# Patient Record
Sex: Male | Born: 1985 | Race: Black or African American | Hispanic: No | Marital: Single | Smoking: Current every day smoker
Health system: Southern US, Community
[De-identification: ages and names within clinical notes are randomized; demographics above are authoritative.]

## PROBLEM LIST (undated history)

## (undated) ENCOUNTER — Emergency Department (HOSPITAL_COMMUNITY): Admission: EM | Disposition: A | Discharge status: Other Acute Inpatient Hospital | Payer: Self-pay

---
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
## 2006-09-04 ENCOUNTER — Emergency Department (HOSPITAL_COMMUNITY): Admission: EM | Admit: 2006-09-04 | Discharge: 2006-09-04 | Payer: Self-pay | Admitting: Emergency Medicine

## 2007-02-21 ENCOUNTER — Emergency Department (HOSPITAL_COMMUNITY): Admission: EM | Admit: 2007-02-21 | Discharge: 2007-02-21 | Payer: Self-pay | Admitting: Emergency Medicine

## 2008-01-18 ENCOUNTER — Emergency Department (HOSPITAL_COMMUNITY): Admission: EM | Admit: 2008-01-18 | Discharge: 2008-01-18 | Payer: Self-pay | Admitting: Emergency Medicine

## 2008-08-05 ENCOUNTER — Emergency Department (HOSPITAL_COMMUNITY): Admission: EM | Admit: 2008-08-05 | Discharge: 2008-08-05 | Payer: Self-pay | Admitting: Emergency Medicine

## 2009-09-13 ENCOUNTER — Emergency Department (HOSPITAL_COMMUNITY): Admission: EM | Admit: 2009-09-13 | Discharge: 2009-09-13 | Payer: Self-pay | Admitting: Emergency Medicine

## 2010-11-18 ENCOUNTER — Emergency Department (HOSPITAL_COMMUNITY)
Admission: EM | Admit: 2010-11-18 | Discharge: 2010-11-18 | Disposition: A | Discharge status: Home / Self Care | Payer: Self-pay | Attending: Emergency Medicine | Admitting: Emergency Medicine

## 2010-11-18 DIAGNOSIS — R1013 Epigastric pain: Secondary | ICD-10-CM | POA: Insufficient documentation

## 2010-11-18 DIAGNOSIS — R079 Chest pain, unspecified: Secondary | ICD-10-CM | POA: Insufficient documentation

## 2010-11-18 DIAGNOSIS — K219 Gastro-esophageal reflux disease without esophagitis: Secondary | ICD-10-CM | POA: Insufficient documentation

## 2010-11-18 DIAGNOSIS — R066 Hiccough: Secondary | ICD-10-CM | POA: Insufficient documentation

## 2013-07-18 ENCOUNTER — Encounter (HOSPITAL_COMMUNITY): Payer: Self-pay | Admitting: Emergency Medicine

## 2013-07-18 ENCOUNTER — Emergency Department (HOSPITAL_COMMUNITY)
Admission: EM | Admit: 2013-07-18 | Discharge: 2013-07-18 | Disposition: A | Discharge status: Home / Self Care | Payer: Self-pay | Attending: Emergency Medicine | Admitting: Emergency Medicine

## 2013-07-18 ENCOUNTER — Emergency Department (INDEPENDENT_AMBULATORY_CARE_PROVIDER_SITE_OTHER)
Admission: EM | Admit: 2013-07-18 | Discharge: 2013-07-18 | Disposition: A | Discharge status: Home / Self Care | Payer: Self-pay | Source: Home / Self Care | Attending: Family Medicine | Admitting: Family Medicine

## 2013-07-18 DIAGNOSIS — K029 Dental caries, unspecified: Secondary | ICD-10-CM | POA: Insufficient documentation

## 2013-07-18 DIAGNOSIS — F17200 Nicotine dependence, unspecified, uncomplicated: Secondary | ICD-10-CM | POA: Insufficient documentation

## 2013-07-18 DIAGNOSIS — Z79899 Other long term (current) drug therapy: Secondary | ICD-10-CM | POA: Insufficient documentation

## 2013-07-18 DIAGNOSIS — K089 Disorder of teeth and supporting structures, unspecified: Secondary | ICD-10-CM | POA: Insufficient documentation

## 2013-07-18 DIAGNOSIS — K0889 Other specified disorders of teeth and supporting structures: Secondary | ICD-10-CM

## 2013-07-18 DIAGNOSIS — Z881 Allergy status to other antibiotic agents status: Secondary | ICD-10-CM | POA: Insufficient documentation

## 2013-07-18 MED ORDER — KETOROLAC TROMETHAMINE 60 MG/2ML IM SOLN
60.0000 mg | Freq: Once | INTRAMUSCULAR | Status: AC
  Administered 2013-07-18: 60 mg via INTRAMUSCULAR
  Filled 2013-07-18: qty 2

## 2013-07-18 MED ORDER — AMOXICILLIN 500 MG PO CAPS: 500.0000 mg | ORAL_CAPSULE | Freq: Three times a day (TID) | ORAL | Status: DC

## 2013-07-18 MED ORDER — HYDROCODONE-ACETAMINOPHEN 5-325 MG PO TABS: 1.0000 | ORAL_TABLET | Freq: Four times a day (QID) | ORAL | Status: DC | PRN

## 2013-07-18 MED ORDER — HYDROCODONE-ACETAMINOPHEN 5-325 MG PO TABS
ORAL_TABLET | ORAL | Status: AC
  Filled 2013-07-18: qty 1

## 2013-07-18 MED ORDER — HYDROCODONE-ACETAMINOPHEN 5-325 MG PO TABS
1.0000 | ORAL_TABLET | Freq: Once | ORAL | Status: AC
  Administered 2013-07-18: 1 via ORAL

## 2013-07-18 NOTE — ED Provider Notes (Signed)
Juan Park is a 27 y.o. male who presents to Urgent Care today for left rear lower tooth pain present for one day. Patient denies any injury. The pain is severe and worse with chewing. History ibuprofen which has not helped. No fevers or chills nausea vomiting or diarrhea.   History reviewed. No pertinent past medical history. History  Substance Use Topics  . Smoking status: Current Every Day Smoker  . Smokeless tobacco: Not on file  . Alcohol Use: Yes   ROS as above Medications reviewed. No current facility-administered medications for this encounter.   Current Outpatient Prescriptions  Medication Sig Dispense Refill  . amoxicillin (AMOXIL) 500 MG capsule Take 1 capsule (500 mg total) by mouth 3 (three) times daily.  60 capsule  0  . HYDROcodone-acetaminophen (NORCO/VICODIN) 5-325 MG per tablet Take 1 tablet by mouth every 6 (six) hours as needed.  15 tablet  0    Exam:  BP 137/89  Pulse 60  Temp(Src) 97.9 F (36.6 C) (Oral)  Resp 14  SpO2 100% Gen: Well NAD HEENT: EOMI,  MMM, poor dentition throughout. Broken left rear most tooth. Tender to touch. Gumline erythema present.  .   Assessment and Plan: 27 y.o. male with dental pain. Plan to treat with amoxicillin, and Norco. Refer patient to dentist.  Discussed warning signs or symptoms. Please see discharge instructions. Patient expresses understanding.      Rodolph Bong, MD 07/18/13 [phone]

## 2013-07-18 NOTE — ED Notes (Signed)
Restless; c/o toothache since this AM; no relief w OTC

## 2013-07-18 NOTE — ED Notes (Signed)
Discharge instructions reviewed with pt. Pt verbalized understanding.   

## 2013-07-18 NOTE — ED Provider Notes (Signed)
CSN: [id number]     Arrival date & time 07/18/13  2041 History  This chart was scribed for non-physician practitioner Francee Piccolo PA-C working with Junius Argyle, MD, by Andrew Au, ED Scribe. This patient was seen in room TR07C/TR07C and the patient's care was started at 10:32 PM     Chief Complaint  Patient presents with  . Dental Pain    The history is provided by the patient. No language interpreter was used.   HPI Comments: Juan Park is a 27 y.o. male who presents to the Emergency Department complaining of 1 day of gradual onset, gradually worsening, constant left upper dental pain. He rates dental pain 10/10 and describes this pain as sharp. Pt was seen by UC today for similar symptoms and was prescribed amoxicillin, and Norco. Pt states he has taken these medications but without relief. He states he has taken one dose of the Norco. He states nothing eases the pain. Pt denies having fever, HA, drainage or trouble swallowing.   History reviewed. No pertinent past medical history. History reviewed. No pertinent past surgical history. No family history on file. History  Substance Use Topics  . Smoking status: Current Every Day Smoker  . Smokeless tobacco: Not on file  . Alcohol Use: Yes    Review of Systems  Constitutional: Negative for fever.  HENT: Positive for dental problem. Negative for trouble swallowing.   Neurological: Negative for headaches.    Allergies  Erythromycin  Home Medications   Current Outpatient Rx  Name  Route  Sig  Dispense  Refill  . amoxicillin (AMOXIL) 500 MG capsule   Oral   Take 1 capsule (500 mg total) by mouth 3 (three) times daily.   60 capsule   0   . HYDROcodone-acetaminophen (NORCO/VICODIN) 5-325 MG per tablet   Oral   Take 1 tablet by mouth every 6 (six) hours as needed for moderate pain.         Marland Kitchen ibuprofen (ADVIL,MOTRIN) 800 MG tablet   Oral   Take 800 mg by mouth every 8 (eight) hours as needed for  moderate pain.          There were no vitals taken for this visit. Physical Exam  Constitutional: He is oriented to person, place, and time. He appears well-developed and well-nourished. No distress.  HENT:  Head: Normocephalic and atraumatic.  Right Ear: External ear normal.  Left Ear: External ear normal.  Nose: Nose normal.  Mouth/Throat: Uvula is midline and mucous membranes are normal. No trismus in the jaw. Abnormal dentition. Dental caries present. No uvula swelling. No oropharyngeal exudate.  Eyes: Conjunctivae are normal.  Neck: Normal range of motion. Neck supple.  Cardiovascular: Normal rate.   Pulmonary/Chest: Effort normal.  Lymphadenopathy:    He has no cervical adenopathy.  Neurological: He is alert and oriented to person, place, and time.  Skin: Skin is warm and dry. He is not diaphoretic.  Psychiatric: He has a normal mood and affect.    ED Course  Procedures  DIAGNOSTIC STUDIES: Oxygen Saturation is 100% on RA, normal by my interpretation.    COORDINATION OF CARE:  10:39 PM-Discussed treatment plan which includes Toradol with pt at bedside and pt agreed to plan.   Labs Review Labs Reviewed - No data to display Imaging Review No results found.  EKG Interpretation   None       MDM   1. Pain, dental     Patient demanding to be given "the  strong pain medication" advised patient he could not demand pain medication.  Afebrile, NAD, non-toxic appearing, AAOx4. Patient with toothache.  No gross abscess.  Exam unconcerning for Ludwig's angina or spread of infection.  Will treat pain in ED with Toradol. Advised to take prescribed antibiotic and pain medication by [facility], that I would not change his pain medication. Patient is agreeable to plan. Patient is stable at time of discharge     I personally performed the services described in this documentation, which was scribed in my presence. The recorded information has been reviewed and is  accurate.    Jeannetta Ellis, PA-C 07/18/13 2343

## 2013-07-18 NOTE — ED Notes (Signed)
C/o dental pain, seen at [facility], here from [facility], was given vicodin, ibuprofen and amox, no relief, here for pain control, pinpoints to L upper molar area pain, some swelling noted, (denies: fever, dizziness, drainage,nv), pacing, "can't sit down".

## 2013-07-18 NOTE — ED Notes (Signed)
Offered pt Ibuprofen for pain until PA comes to see him. Pt instructed me that he has taken a bunch today, unable to tell me how many he has taken or the last time he took it.

## 2013-07-18 NOTE — ED Notes (Signed)
Jen PA at bedside. 

## 2013-07-18 NOTE — ED Notes (Signed)
Pt c/o pain to upper left side of mouth. Pt has some swelling to gum. Pt was seen at urgent care and given vicodin and antibiotic pt states he has had no relief. Pt up pacing the room. Pt rates pain 10/10.

## 2013-07-19 NOTE — ED Provider Notes (Signed)
Medical screening examination/treatment/procedure(s) were performed by non-physician practitioner and as supervising physician I was immediately available for consultation/collaboration.  EKG Interpretation   None         Junius Argyle, MD 07/19/13 1257

## 2014-05-21 ENCOUNTER — Emergency Department (HOSPITAL_COMMUNITY): Payer: Self-pay

## 2014-05-21 ENCOUNTER — Encounter (HOSPITAL_COMMUNITY): Payer: Self-pay | Admitting: Emergency Medicine

## 2014-05-21 ENCOUNTER — Emergency Department (HOSPITAL_COMMUNITY)
Admission: EM | Admit: 2014-05-21 | Discharge: 2014-05-21 | Disposition: A | Discharge status: Home / Self Care | Payer: Self-pay | Attending: Emergency Medicine | Admitting: Emergency Medicine

## 2014-05-21 DIAGNOSIS — J40 Bronchitis, not specified as acute or chronic: Secondary | ICD-10-CM | POA: Insufficient documentation

## 2014-05-21 DIAGNOSIS — Z720 Tobacco use: Secondary | ICD-10-CM | POA: Insufficient documentation

## 2014-05-21 MED ORDER — PREDNISONE 10 MG PO TABS: 20.0000 mg | ORAL_TABLET | Freq: Every day | ORAL | Status: AC

## 2014-05-21 MED ORDER — ALBUTEROL SULFATE HFA 108 (90 BASE) MCG/ACT IN AERS: 1.0000 | INHALATION_SPRAY | Freq: Four times a day (QID) | RESPIRATORY_TRACT | Status: DC | PRN

## 2014-05-21 NOTE — ED Notes (Signed)
Pt. reports productive cough for 1 week , denies fever or chills , respirations unlabored .

## 2014-05-21 NOTE — ED Notes (Signed)
This RN into pt room to complete rapid strep- pt refused.  Dr. Freida BusmanAllen made aware.  Discharge paperwork completed.

## 2014-05-21 NOTE — ED Provider Notes (Signed)
CSN: [id number]     Arrival date & time 05/21/14  0020 History   First MD Initiated Contact with Patient 05/21/14 0200     Chief Complaint  Patient presents with  . Cough     (Consider location/radiation/quality/duration/timing/severity/associated sxs/prior Treatment) HPI Comments: Patient here with productive cough x1 week that is worse in the morning. He is to daily tobacco user. No fever or chills. Cough has been nonproductive. No vomiting or diarrhea. Has had ear pain as well as sore throat denies any trouble swallowing. Symptoms persistent and unresponsive to over-the-counter medications. Denies any neck pain or photophobia. No urinary symptoms.  Patient is a 28 y.o. male presenting with cough. The history is provided by the patient.  Cough   History reviewed. No pertinent past medical history. History reviewed. No pertinent past surgical history. No family history on file. History  Substance Use Topics  . Smoking status: Current Every Day Smoker  . Smokeless tobacco: Not on file  . Alcohol Use: Yes    Review of Systems  Respiratory: Positive for cough.   All other systems reviewed and are negative.     Allergies  Erythromycin  Home Medications   Prior to Admission medications   Medication Sig Start Date End Date Taking? Authorizing Provider  HYDROcodone-acetaminophen (NORCO/VICODIN) 5-325 MG per tablet Take 1 tablet by mouth every 6 (six) hours as needed for moderate pain.    Historical Provider, MD  ibuprofen (ADVIL,MOTRIN) 800 MG tablet Take 800 mg by mouth every 8 (eight) hours as needed for moderate pain.    Historical Provider, MD   BP 121/76  Pulse 65  Temp(Src) 98.4 F (36.9 C) (Oral)  Resp 18  Ht 5\' 6"  (1.676 m)  Wt 217 lb (98.431 kg)  BMI 35.04 kg/m2  SpO2 96% Physical Exam  Nursing note and vitals reviewed. Constitutional: He is oriented to person, place, and time. He appears well-developed and well-nourished.  Non-toxic appearance. No distress.   HENT:  Head: Normocephalic and atraumatic.  Mouth/Throat: Posterior oropharyngeal erythema present. No oropharyngeal exudate or posterior oropharyngeal edema.  Eyes: Conjunctivae, EOM and lids are normal. Pupils are equal, round, and reactive to light.  Neck: Normal range of motion. Neck supple. No tracheal deviation present. No mass present.  Cardiovascular: Normal rate, regular rhythm and normal heart sounds.  Exam reveals no gallop.   No murmur heard. Pulmonary/Chest: Effort normal and breath sounds normal. No stridor. No respiratory distress. He has no decreased breath sounds. He has no wheezes. He has no rhonchi. He has no rales.  Abdominal: Soft. Normal appearance and bowel sounds are normal. He exhibits no distension. There is no tenderness. There is no rebound and no CVA tenderness.  Musculoskeletal: Normal range of motion. He exhibits no edema and no tenderness.  Neurological: He is alert and oriented to person, place, and time. He has normal strength. No cranial nerve deficit or sensory deficit. GCS eye subscore is 4. GCS verbal subscore is 5. GCS motor subscore is 6.  Skin: Skin is warm and dry. No abrasion and no rash noted.  Psychiatric: He has a normal mood and affect. His speech is normal and behavior is normal.    ED Course  Procedures (including critical care time) Labs Review Labs Reviewed  RAPID STREP SCREEN    Imaging Review Dg Chest 2 View  05/21/2014   CLINICAL DATA:  Persistent productive cough x2 weeks.  EXAM: CHEST  2 VIEW  COMPARISON:  09/04/2006.  FINDINGS: Mediastinum and hilar structures  are normal. No acute cardiopulmonary disease noted. No pleural effusion or pneumothorax. No acute bony abnormality. Chest is stable from prior study.  IMPRESSION: No active cardiopulmonary disease.   Electronically Signed   By: Maisie Fushomas  Register   On: 05/21/2014 01:45     EKG Interpretation None      MDM   Final diagnoses:  None    Patient offered rapid strep and  has refused. We'll treat for both bronchitis    Toy BakerAnthony T Yarixa Lightcap, MD 05/21/14 [phone]

## 2014-05-21 NOTE — Discharge Instructions (Signed)
Smoking Cessation °Quitting smoking is important to your health and has many advantages. However, it is not always easy to quit since nicotine is a very addictive drug. Oftentimes, people try 3 times or more before being able to quit. This document explains the best ways for you to prepare to quit smoking. Quitting takes hard work and a lot of effort, but you can do it. °ADVANTAGES OF QUITTING SMOKING °· You will live longer, feel better, and live better. °· Your body will feel the impact of quitting smoking almost immediately. °¨ Within 20 minutes, blood pressure decreases. Your pulse returns to its normal level. °¨ After 8 hours, carbon monoxide levels in the blood return to normal. Your oxygen level increases. °¨ After 24 hours, the chance of having a heart attack starts to decrease. Your breath, hair, and body stop smelling like smoke. °¨ After 48 hours, damaged nerve endings begin to recover. Your sense of taste and smell improve. °¨ After 72 hours, the body is virtually free of nicotine. Your bronchial tubes relax and breathing becomes easier. °¨ After 2 to 12 weeks, lungs can hold more air. Exercise becomes easier and circulation improves. °· The risk of having a heart attack, stroke, cancer, or lung disease is greatly reduced. °¨ After 1 year, the risk of coronary heart disease is cut in half. °¨ After 5 years, the risk of stroke falls to the same as a nonsmoker. °¨ After 10 years, the risk of lung cancer is cut in half and the risk of other cancers decreases significantly. °¨ After 15 years, the risk of coronary heart disease drops, usually to the level of a nonsmoker. °· If you are pregnant, quitting smoking will improve your chances of having a healthy baby. °· The people you live with, especially any children, will be healthier. °· You will have extra money to spend on things other than cigarettes. °QUESTIONS TO THINK ABOUT BEFORE ATTEMPTING TO QUIT °You may want to talk about your answers with your  health care provider. °· Why do you want to quit? °· If you tried to quit in the past, what helped and what did not? °· What will be the most difficult situations for you after you quit? How will you plan to handle them? °· Who can help you through the tough times? Your family? Friends? A health care provider? °· What pleasures do you get from smoking? What ways can you still get pleasure if you quit? °Here are some questions to ask your health care provider: °· How can you help me to be successful at quitting? °· What medicine do you think would be best for me and how should I take it? °· What should I do if I need more help? °· What is smoking withdrawal like? How can I get information on withdrawal? °GET READY °· Set a quit date. °· Change your environment by getting rid of all cigarettes, ashtrays, matches, and lighters in your home, car, or work. Do not let people smoke in your home. °· Review your past attempts to quit. Think about what worked and what did not. °GET SUPPORT AND ENCOURAGEMENT °You have a better chance of being successful if you have help. You can get support in many ways. °· Tell your family, friends, and coworkers that you are going to quit and need their support. Ask them not to smoke around you. °· Get individual, group, or telephone counseling and support. Programs are available at local hospitals and health centers. Call   your local health department for information about programs in your area. °· Spiritual beliefs and practices may help some smokers quit. °· Download a "quit meter" on your computer to keep track of quit statistics, such as how long you have gone without smoking, cigarettes not smoked, and money saved. °· Get a self-help book about quitting smoking and staying off tobacco. °LEARN NEW SKILLS AND BEHAVIORS °· Distract yourself from urges to smoke. Talk to someone, go for a walk, or occupy your time with a task. °· Change your normal routine. Take a different route to work.  Drink tea instead of coffee. Eat breakfast in a different place. °· Reduce your stress. Take a hot bath, exercise, or read a book. °· Plan something enjoyable to do every day. Reward yourself for not smoking. °· Explore interactive web-based programs that specialize in helping you quit. °GET MEDICINE AND USE IT CORRECTLY °Medicines can help you stop smoking and decrease the urge to smoke. Combining medicine with the above behavioral methods and support can greatly increase your chances of successfully quitting smoking. °· Nicotine replacement therapy helps deliver nicotine to your body without the negative effects and risks of smoking. Nicotine replacement therapy includes nicotine gum, lozenges, inhalers, nasal sprays, and skin patches. Some may be available over-the-counter and others require a prescription. °· Antidepressant medicine helps people abstain from smoking, but how this works is unknown. This medicine is available by prescription. °· Nicotinic receptor partial agonist medicine simulates the effect of nicotine in your brain. This medicine is available by prescription. °Ask your health care provider for advice about which medicines to use and how to use them based on your health history. Your health care provider will tell you what side effects to look out for if you choose to be on a medicine or therapy. Carefully read the information on the package. Do not use any other product containing nicotine while using a nicotine replacement product.  °RELAPSE OR DIFFICULT SITUATIONS °Most relapses occur within the first 3 months after quitting. Do not be discouraged if you start smoking again. Remember, most people try several times before finally quitting. You may have symptoms of withdrawal because your body is used to nicotine. You may crave cigarettes, be irritable, feel very hungry, cough often, get headaches, or have difficulty concentrating. The withdrawal symptoms are only temporary. They are strongest  when you first quit, but they will go away within 10-14 days. °To reduce the chances of relapse, try to: °· Avoid drinking alcohol. Drinking lowers your chances of successfully quitting. °· Reduce the amount of caffeine you consume. Once you quit smoking, the amount of caffeine in your body increases and can give you symptoms, such as a rapid heartbeat, sweating, and anxiety. °· Avoid smokers because they can make you want to smoke. °· Do not let weight gain distract you. Many smokers will gain weight when they quit, usually less than 10 pounds. Eat a healthy diet and stay active. You can always lose the weight gained after you quit. °· Find ways to improve your mood other than smoking. °FOR MORE INFORMATION  °www.smokefree.gov  °Document Released: 07/22/2001 Document Revised: 12/12/2013 Document Reviewed: 11/06/2011 °Exit[employer]® Patient Information ©2015 [employer]. This information is not intended to replace advice given to you by your health care provider. Make sure you discuss any questions you have with your health care provider. °Acute Bronchitis °Bronchitis is inflammation of the airways that extend from the windpipe into the lungs (bronchi). The inflammation often causes mucus   to develop. This leads to a cough, which is the most common symptom of bronchitis.  °In acute bronchitis, the condition usually develops suddenly and goes away over time, usually in a couple weeks. Smoking, allergies, and asthma can make bronchitis worse. Repeated episodes of bronchitis may cause further lung problems.  °CAUSES °Acute bronchitis is most often caused by the same virus that causes a cold. The virus can spread from person to person (contagious) through coughing, sneezing, and touching contaminated objects. °SIGNS AND SYMPTOMS  °· Cough.   °· Fever.   °· Coughing up mucus.   °· Body aches.   °· Chest congestion.   °· Chills.   °· Shortness of breath.   °· Sore throat.   °DIAGNOSIS  °Acute bronchitis is usually diagnosed  through a physical exam. Your health care provider will also ask you questions about your medical history. Tests, such as chest X-rays, are sometimes done to rule out other conditions.  °TREATMENT  °Acute bronchitis usually goes away in a couple weeks. Oftentimes, no medical treatment is necessary. Medicines are sometimes given for relief of fever or cough. Antibiotic medicines are usually not needed but may be prescribed in certain situations. In some cases, an inhaler may be recommended to help reduce shortness of breath and control the cough. A cool mist vaporizer may also be used to help thin bronchial secretions and make it easier to clear the chest.  °HOME CARE INSTRUCTIONS °· Get plenty of rest.   °· Drink enough fluids to keep your urine clear or pale yellow (unless you have a medical condition that requires fluid restriction). Increasing fluids may help thin your respiratory secretions (sputum) and reduce chest congestion, and it will prevent dehydration.   °· Take medicines only as directed by your health care provider. °· If you were prescribed an antibiotic medicine, finish it all even if you start to feel better. °· Avoid smoking and secondhand smoke. Exposure to cigarette smoke or irritating chemicals will make bronchitis worse. If you are a smoker, consider using nicotine gum or skin patches to help control withdrawal symptoms. Quitting smoking will help your lungs heal faster.   °· Reduce the chances of another bout of acute bronchitis by washing your hands frequently, avoiding people with cold symptoms, and trying not to touch your hands to your mouth, nose, or eyes.   °· Keep all follow-up visits as directed by your health care provider.   °SEEK MEDICAL CARE IF: °Your symptoms do not improve after 1 week of treatment.  °SEEK IMMEDIATE MEDICAL CARE IF: °· You develop an increased fever or chills.   °· You have chest pain.   °· You have severe shortness of breath. °· You have bloody sputum.   °· You  develop dehydration. °· You faint or repeatedly feel like you are going to pass out. °· You develop repeated vomiting. °· You develop a severe headache. °MAKE SURE YOU:  °· Understand these instructions. °· Will watch your condition. °· Will get help right away if you are not doing well or get worse. °Document Released: 09/04/2004 Document Revised: 12/12/2013 Document Reviewed: 01/18/2013 °ExitCare® Patient Information ©2015 [employer]. This information is not intended to replace advice given to you by your health care provider. Make sure you discuss any questions you have with your health care provider. ° °

## 2014-08-03 ENCOUNTER — Emergency Department (HOSPITAL_COMMUNITY)
Admission: EM | Admit: 2014-08-03 | Discharge: 2014-08-03 | Disposition: A | Discharge status: Home / Self Care | Payer: Self-pay | Attending: Emergency Medicine | Admitting: Emergency Medicine

## 2014-08-03 ENCOUNTER — Encounter (HOSPITAL_COMMUNITY): Payer: Self-pay | Admitting: Emergency Medicine

## 2014-08-03 DIAGNOSIS — R066 Hiccough: Secondary | ICD-10-CM | POA: Insufficient documentation

## 2014-08-03 DIAGNOSIS — Z720 Tobacco use: Secondary | ICD-10-CM | POA: Insufficient documentation

## 2014-08-03 DIAGNOSIS — R112 Nausea with vomiting, unspecified: Secondary | ICD-10-CM | POA: Insufficient documentation

## 2014-08-03 DIAGNOSIS — Z7952 Long term (current) use of systemic steroids: Secondary | ICD-10-CM | POA: Insufficient documentation

## 2014-08-03 MED ORDER — GI COCKTAIL ~~LOC~~
30.0000 mL | Freq: Once | ORAL | Status: AC
  Administered 2014-08-03: 30 mL via ORAL
  Filled 2014-08-03: qty 30

## 2014-08-03 MED ORDER — CHLORPROMAZINE HCL 25 MG PO TABS
50.0000 mg | ORAL_TABLET | Freq: Once | ORAL | Status: AC
  Administered 2014-08-03: 50 mg via ORAL
  Filled 2014-08-03: qty 2

## 2014-08-03 MED ORDER — CHLORPROMAZINE HCL 50 MG PO TABS
50.0000 mg | ORAL_TABLET | Freq: Three times a day (TID) | ORAL | Status: AC | PRN
Start: 2014-08-03 — End: ?

## 2014-08-03 NOTE — ED Notes (Signed)
Pt reports hiccups for past 2 days. Hx of same in past which was relieved with GI cocktail. sts it is becoming painful.

## 2014-08-03 NOTE — Discharge Instructions (Signed)
Hiccups A hiccup is the result of a sudden shortening of the muscle below your lungs (diaphragm). This movement of your diaphragm is then followed by the closing of your vocal cords, which causes the hiccup sound. Most people get the hiccups. Typically, hiccups last only a short amount of time. There are three types of hiccups:   Benign: last less than 48 hours.  Persistent: last more than 48 hours, but less than 1 month.  Intractable: last more than 1 month. A hiccup is a reflex. You cannot control reflexes. CAUSES  Causes of the hiccups can include:   Eating too much.  Drinking too much alcohol or fizzy drinks.  Eating too fast.  Eating or drinking hot and spicy foods or drinks.  Using certain medicines that have hiccupping as a side effect. Several medical conditions may also cause hiccups, including, but not limited to:  Stroke.  Gastroesophageal reflux.  Multiple sclerosis.  Traumatic brain injury.  Brain tumor.  Meningitis.  Having damage to the nerve that affects the diaphragm. Usually, though, hiccups have no apparent cause and are not the result of a serious medical condition. DIAGNOSIS  Tests may be performed to diagnose a possible condition associated with persistent or intractable hiccups. TREATMENT  Most cases of the hiccups need no treatment. None of the numerous home remedies have been proven to be effective. If your hiccups do require treatment, your treatment may include:  Medicine. Medicine may be given intravenously (by IV) or by mouth.  Hypnosis or acupuncture.  Surgery to the nerve that affects the diaphragm may be tried in severe cases. If your hiccups are caused by an underlying medical condition, treatment for the medical condition may be necessary.  HOME CARE INSTRUCTIONS   Eat small meals.  Limit alcohol intake to no more than 1 drink per day for nonpregnant women and 2 drinks per day for men. One drink equals 12 ounces of beer, 5 ounces  of wine, or 1 ounces of hard liquor.  Limit drinking fizzy drinks.  Eat and chew your food slowly.  Take medicines only as directed by your health care provider. SEEK MEDICAL CARE IF:   Your hiccups last for more than 48 hours.  You are given medicine, but your hiccups do not get better.  You cannot sleep or eat due to the hiccups.  You have unexpected weight loss due to the hiccups.  You have trouble breathing or swallowing.  You have a fever.  You develop severe pain in your abdomen.  You develop numbness, tingling, or weakness. Document Released: 10/06/2001 Document Revised: 12/12/2013 Document Reviewed: 09/18/2010 [facility] Patient Information 2015 [place] [place]. This information is not intended to replace advice given to you by your health care provider. Make sure you discuss any questions you have with your health care provider.   Emergency Department Resource Guide 1) Find a Doctor and Pay Out of Pocket Although you won't have to find out who is covered by your insurance plan, it is a good idea to ask around and get recommendations. You will then need to call the office and see if the doctor you have chosen will accept you as a new patient and what types of options they offer for patients who are self-pay. Some doctors offer discounts or will set up payment plans for their patients who do not have insurance, but you will need to ask so you aren't surprised when you get to your appointment.  2) Contact Your Local Health Department Not all  health departments have doctors that can see patients for sick visits, but many do, so it is worth a call to see if yours does. If you don't know where your local health department is, you can check in your phone book. The CDC also has a tool to help you locate your state's health department, and many state websites also have listings of all of their local health departments.  3) Find a Walk-in Clinic If your illness is not likely to be  very severe or complicated, you may want to try a walk in clinic. These are popping up all over the country in pharmacies, drugstores, and shopping centers. They're usually staffed by nurse practitioners or physician assistants that have been trained to treat common illnesses and complaints. They're usually fairly quick and inexpensive. However, if you have serious medical issues or chronic medical problems, these are probably not your best option.  No Primary Care Doctor: - Call Health Connect at  [phone] - they can help you locate a primary care doctor that  accepts your insurance, provides certain services, etc. - Physician Referral Service- [phone]  Chronic Pain Problems: Organization         Address  Phone   Notes  [facility]  [phone] Patients need to be referred by their primary care doctor.   Medication Assistance: Organization         Address  Phone   Notes  [facility] Medication [facility] [address] [place], [place] [address] [phone] --Must be a resident of [facility] -- Must have NO insurance coverage whatsoever (no Medicaid/ Medicare, etc.) -- The pt. MUST have a primary care doctor that directs their care regularly and follows them in the community   [facility]  [phone]   [employer]  [phone]    Agencies that provide inexpensive medical care: Organization         Address  Phone   Notes  [facility]  [phone]   [facility]    [phone]   [facility] [address] [place] [place], [place] [address] [phone]   [facility] [address] [place] [place] [phone]   Planned Parenthood    [phone]   [facility]    [phone]   Community [facility] and [facility]  [address], [place] Phone:  [phone], Fax:  [phone] Hours of Operation:  9 am - 6 pm, M-F.  Also  accepts Medicaid/Medicare and self-pay.  [facility]  [address], [employer] Phone: [phone], Fax: [phone]. Hours of Operation:  8:30 am - 5:30 pm, M-F.  Also accepts Medicaid and self-pay.  [facility] [place] [address] [place] Phone: [phone]   [facility] [address] Natasha Bence [place], [place] [phone], Ext. [phone] Mondays & Thursdays: 7-9 AM.  First 15 patients are seen on a first come, first serve basis.    Medicaid-accepting [facility] Providers:  Organization         Address  Phone   Notes  [facility] [address], [place] [phone] Also accepts self-pay patients.  [facility] [address] Laurell Josephs [place], [place]  [phone]   [facility] [address], [place] [phone]   [facility] [address] [place] [phone]   Renaye Rakers [address], [place]   [phone] Only accepts [place] patients after they have their name applied to their card.   Self-Pay (no insurance) in [facility]:  Organization         Address  Phone   Notes  Sickle Cell Patients, [facility] [address] [place], [place] [phone]   [facility] [address] [place], [place] [phone]   [facility] [place]  [address],  [phone]   [facility]/Dr. Osei-Bonsu  [address] [place] or [address], [place] [phone] Phone number for both [place] and [place] locations is the same.  [facility]gent Medical and [facility] [address], [place] [phone]   [facility] [address] [place] or [address] [phone]   [facility] [address],  [place] [phone], phone; [phone], fax Sees patients 1st and 3rd Saturday of every month.  Must not qualify for public or private insurance (i.e. Medicaid, Medicare, [place] Health Choice, Veterans' Benefits)  Household income should be no more than 200% of the poverty level The clinic cannot treat you if you are pregnant or think you are pregnant  Sexually transmitted diseases are not treated at the clinic.    Dental Care: Organization         Address  Phone  Notes  [facility] Department of [facility] [address] [place], [place] [phone] Accepts children up to age 1 who are enrolled in [place] or [place] Health Choice; pregnant women with a Medicaid card; and children who have applied for Medicaid or [place] Health Choice, but were declined, whose parents can pay a reduced fee at time of service.  [facility] of [facility]  [address], [place] [phone] Accepts children up to age 75 who are enrolled in [place] or [place] Health Choice; pregnant women with a Medicaid card; and children who have applied for Medicaid or  Health Choice, but were declined, whose parents can pay a reduced fee at time of service.  [facility]  [address] [place], [place] [phone] Patients are seen by appointment only. Walk-ins are not accepted. [facility] will see patients 74 years of age and older. Monday - Tuesday (8am-5pm) Most Wednesdays (8:30-5pm) $30 per visit, cash only  [facility]  [address], [facility] [phone] Patients are seen by appointment only. Walk-ins are not accepted. [facility] will see patients 43 years of age and older. One Wednesday Evening (Monthly: Volunteer Based).  $30 per visit, cash only  [employer]  [phone] for adults; Children under age 24, call Graduate Pediatric Dentistry at [phone].  Children aged 102-14, please call [phone] to request a pediatric application.  Dental services are provided in all areas of dental care including fillings, crowns and bridges, complete and partial dentures, implants, gum treatment, root canals, and extractions. Preventive care is also provided. Treatment is provided to both adults and children. Patients are selected via a lottery and there is often a waiting list.   [facility] [address] [place]  [phone] [email or web address]   [facility] [address], [place], [place] [phone], Ext. [phone] Second and Fourth Thursday of each month, opens at 6:30 AM; Clinic ends at 9 AM.  Patients are seen on a first-come first-served basis, and a limited number are seen during each clinic.   [facility]  [address], [place], [place] [phone]   Eligibility Requirements You must have lived in [place], [place], or [place] counties for at least the last three months.   You cannot be eligible for state or federal sponsored [place] Cityhealthcare insurance, including CIGNAVeterans Administration, [place]icaid, or [employer]Medicare.   You generally cannot be eligible for healthcare insurance through your employer.    How to apply: Eligibility screenings are held every Tuesday and Wednesday afternoon from 1:00 pm until 4:00 pm. You do not need an appointment for the interview!  [facility] [address], [place], [place] [phone]   [facility]  [phone]   [facility]  [phone]   [facility]  [phone]    Behavioral Health Resources in the Community: Intensive Outpatient Programs Organization         Address  Phone  Notes  [facility] [address], [place], [place] [phone]   [facility] [address], [place], [place] [phone]   [facility] [address], [place], [place]  [phone]   [facility] [address],  [place], [place] [phone] or [phone]   Substance Abuse Resources Organization         Address  Phone  Notes  [facility]  [phone]   [facility]  [phone]   [facility]  [phone]   [facility]  [phone]   Residential & Outpatient Substance Abuse Program  [phone]   Psychological Services Organization         Address  Phone  Notes  [facility]  [phone]- [phone]   [facility]  [phone]- [phone]   [facility] [address], [place] [phone] or [phone]    Mobile Crisis Teams Organization         Address  Phone  Notes  [facility], Mobile Crisis Care Unit  [phone]   [facility]  [address] [place], [place] [phone]   Doristine LocksSharon DeEsch [address] [place] [phone]    Self-Help/Support Groups Organization         Address  Phone             Notes  [facility] [place] - variety of support groups  [phone] Call for more information  Narcotics Anonymous (NA), [facility] [address], [place]  2 meetings at this location   Statisticianesidential Treatment Programs Organization         Address  Phone  Notes  [facility] [address],    [place]  [phone]   [facility]  [address], [place] [phone], [place], [place] [phone]   [facility] [address], [place] [phone] Admissions: 8am-3pm M-F  [facility] [address]    [place], [place] [phone]   [facility] [address], [place], [place] [phone]   [facility] [address]  [place], [place] [phone]   [facility] - Intensive Outpatient [phone], [place], [place] [phone]     [facility] ([facility] [address] [place]  [place], [place] [phone] or [phone]   [facility] ([facility]) [address] [place], [place] [phone] Accepts Medicaid  [facility] [address],  [place] [phone] Substance Abuse/Addiction Treatment   [facility] Organization         Address  Phone  Notes  [facility]  [phone]   Angie Fava, PhD [address] Ervin Knack [place], [place]   [phone] or [phone]   [facility]   [address] [place], [place] [phone]   [facility] [address], [place], [place] [phone] Insurance/Medicaid/sponsorship through [facility] and Families [address]                                    [place], [place] [phone] Therapy/tele-psych/case  [facility] [address], [place] [phone]    Dr. Lolly Mustache  [phone]   [facility] [place]  [employer] Dept. 1) [address], [place] 2) [address] [place] 3)  [address], [place] [phone]  [phone]   [facility] Child Abuse Hotline [phone]) 011-2017 or 5204733460 (After Hours)

## 2014-08-03 NOTE — ED Provider Notes (Signed)
CSN: [id number]     Arrival date & time 08/03/14  1905 History   First MD Initiated Contact with Patient 08/03/14 1912     Chief Complaint  Patient presents with  . Hiccups     (Consider location/radiation/quality/duration/timing/severity/associated sxs/prior Treatment) Patient is a 28 y.o. male presenting with general illness.  Illness Location:  Generalized Quality:  Hiccups Severity:  Moderate Onset quality:  Gradual Duration:  2 days Timing:  Constant Progression:  Unchanged Chronicity:  Recurrent Context:  Prior symptoms in past with resolution after GI cocktail Relieved by:  Nothing Worsened by:  Nothing Associated symptoms: nausea and vomiting   Associated symptoms: no abdominal pain, no chest pain, no cough, no fever, no shortness of breath and no sore throat     History reviewed. No pertinent past medical history. History reviewed. No pertinent past surgical history. No family history on file. History  Substance Use Topics  . Smoking status: Current Every Day Smoker  . Smokeless tobacco: Not on file  . Alcohol Use: Yes    Review of Systems  Constitutional: Negative for fever.  HENT: Negative for sore throat.   Respiratory: Negative for cough and shortness of breath.   Cardiovascular: Negative for chest pain.  Gastrointestinal: Positive for nausea and vomiting. Negative for abdominal pain.  All other systems reviewed and are negative.     Allergies  Erythromycin  Home Medications   Prior to Admission medications   Medication Sig Start Date End Date Taking? Authorizing Provider  chlorproMAZINE (THORAZINE) 50 MG tablet Take 1 tablet (50 mg total) by mouth 3 (three) times daily as needed (Hiccups). 08/03/14   Mirian MoMatthew Davelle Anselmi, MD  predniSONE (DELTASONE) 10 MG tablet Take 2 tablets (20 mg total) by mouth daily. 05/21/14   Toy BakerAnthony T Allen, MD   BP 121/72 mmHg  Pulse 62  Temp(Src) 98.2 F (36.8 C) (Oral)  Resp 18  SpO2 98% Physical Exam   Constitutional: He is oriented to person, place, and time. He appears well-developed and well-nourished.  HENT:  Head: Normocephalic and atraumatic.  Eyes: Conjunctivae and EOM are normal.  Neck: Normal range of motion. Neck supple.  Cardiovascular: Normal rate, regular rhythm and normal heart sounds.   Pulmonary/Chest: Effort normal and breath sounds normal. No respiratory distress.  Abdominal: He exhibits no distension. There is no tenderness. There is no rebound and no guarding.  Musculoskeletal: Normal range of motion.  Neurological: He is alert and oriented to person, place, and time.  Skin: Skin is warm and dry.  Vitals reviewed.   ED Course  Procedures (including critical care time) Labs Review Labs Reviewed - No data to display  Imaging Review No results found.   EKG Interpretation None      MDM   Final diagnoses:  Hiccups    [age] y.o. male with pertinent PMH of prior hiccups presents with recurrent hiccups 2 days.  Patient has no past medical history, takes no medications, denies other symptoms. No other sick contacts with same.  His exam is benign. Given GI cocktail and Thorazine with relief of symptoms. Discharged home in stable condition to follow-up with PCP.  Also instructed him that since this is his second time of having intractable hiccups it was very important that he follow up with her primary care doctor to determine that there was no other underlying etiology as the cause of his symptoms.    I have reviewed all laboratory and imaging studies if ordered as above  1. Hiccups  Mirian MoMatthew [place] Lapaglia, MD 08/03/14 [id number]

## 2015-05-14 IMAGING — CR DG CHEST 2V
2 series · 2 of 2 positions shown · non-contrast
Comparison: 09/04/2006.

CLINICAL DATA: Persistent productive cough x2 weeks.

EXAM:
CHEST  2 VIEW

[w chest pa]
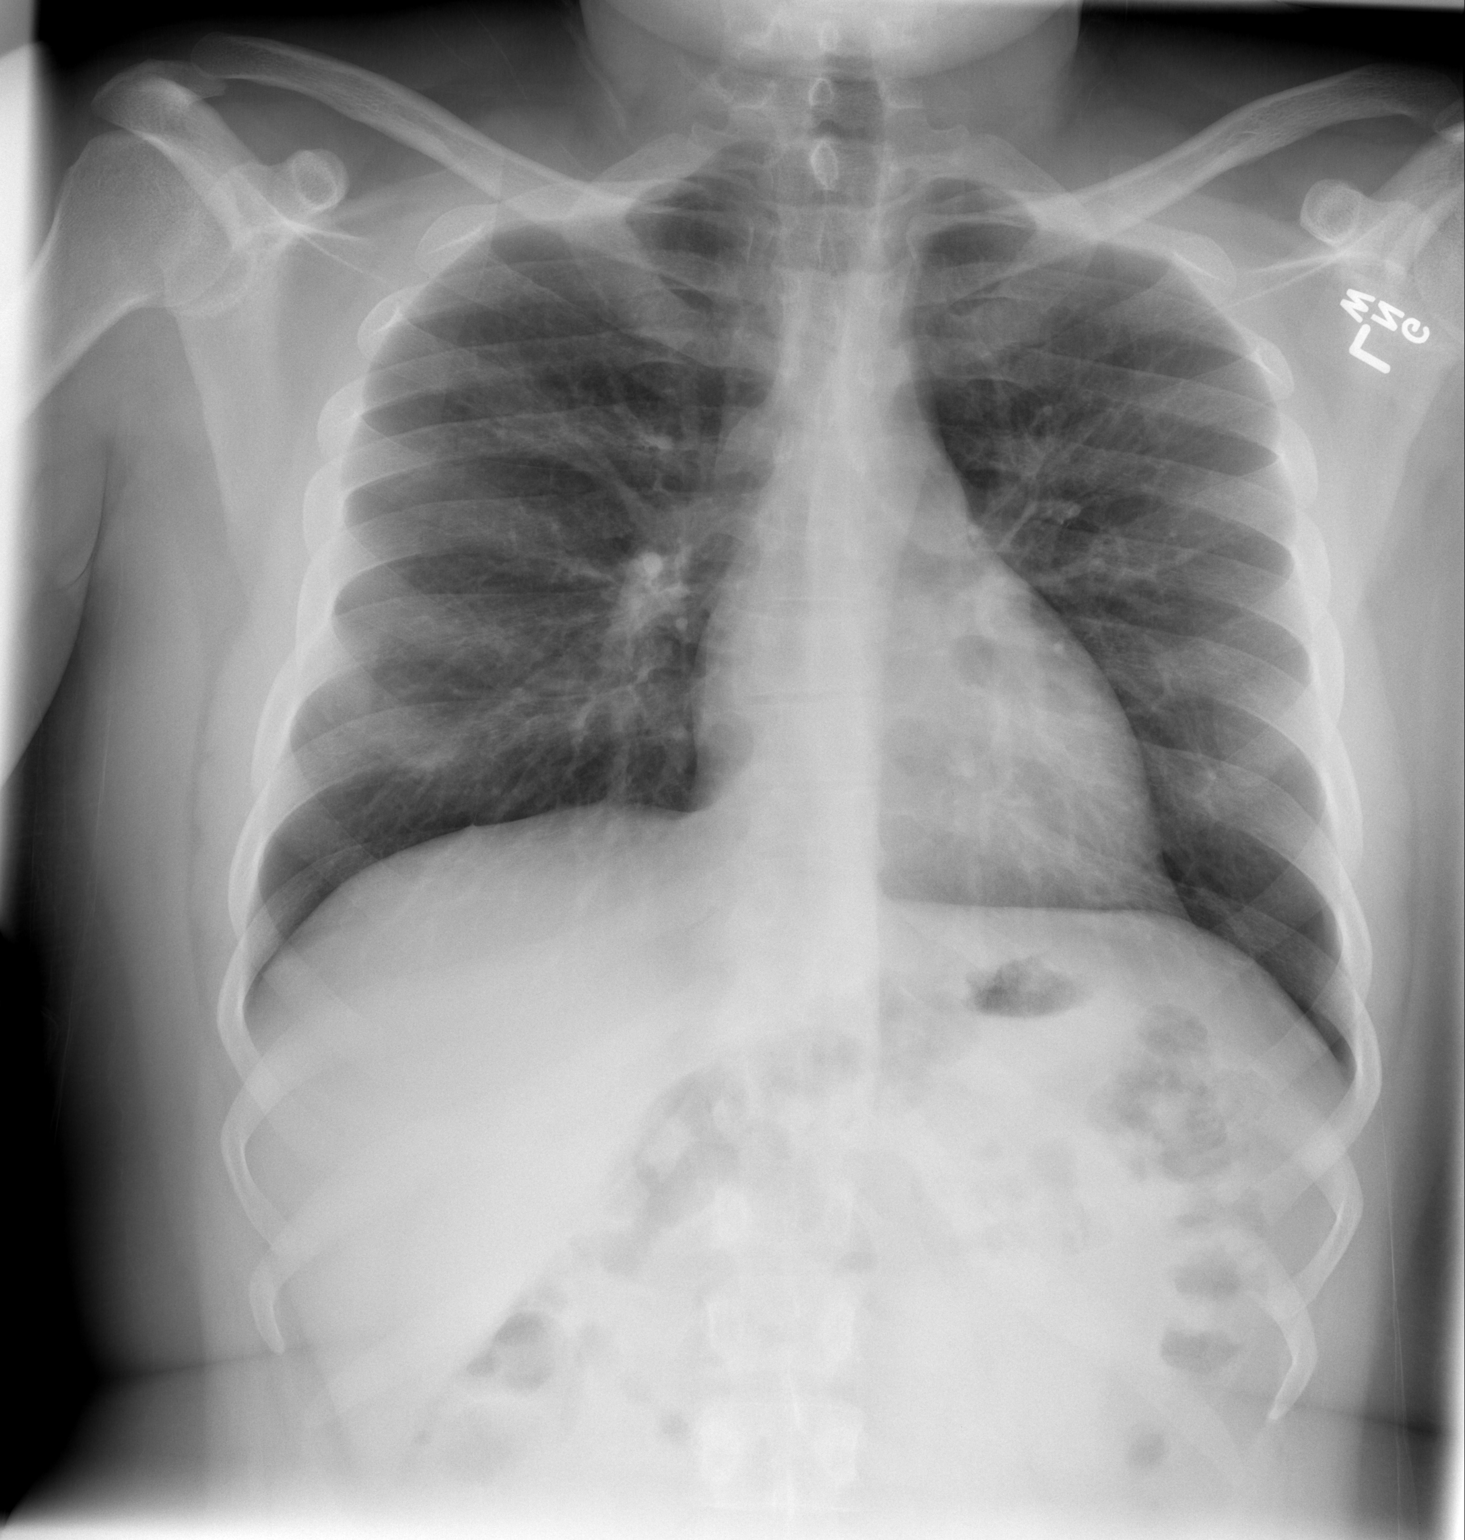

[w chest lat]
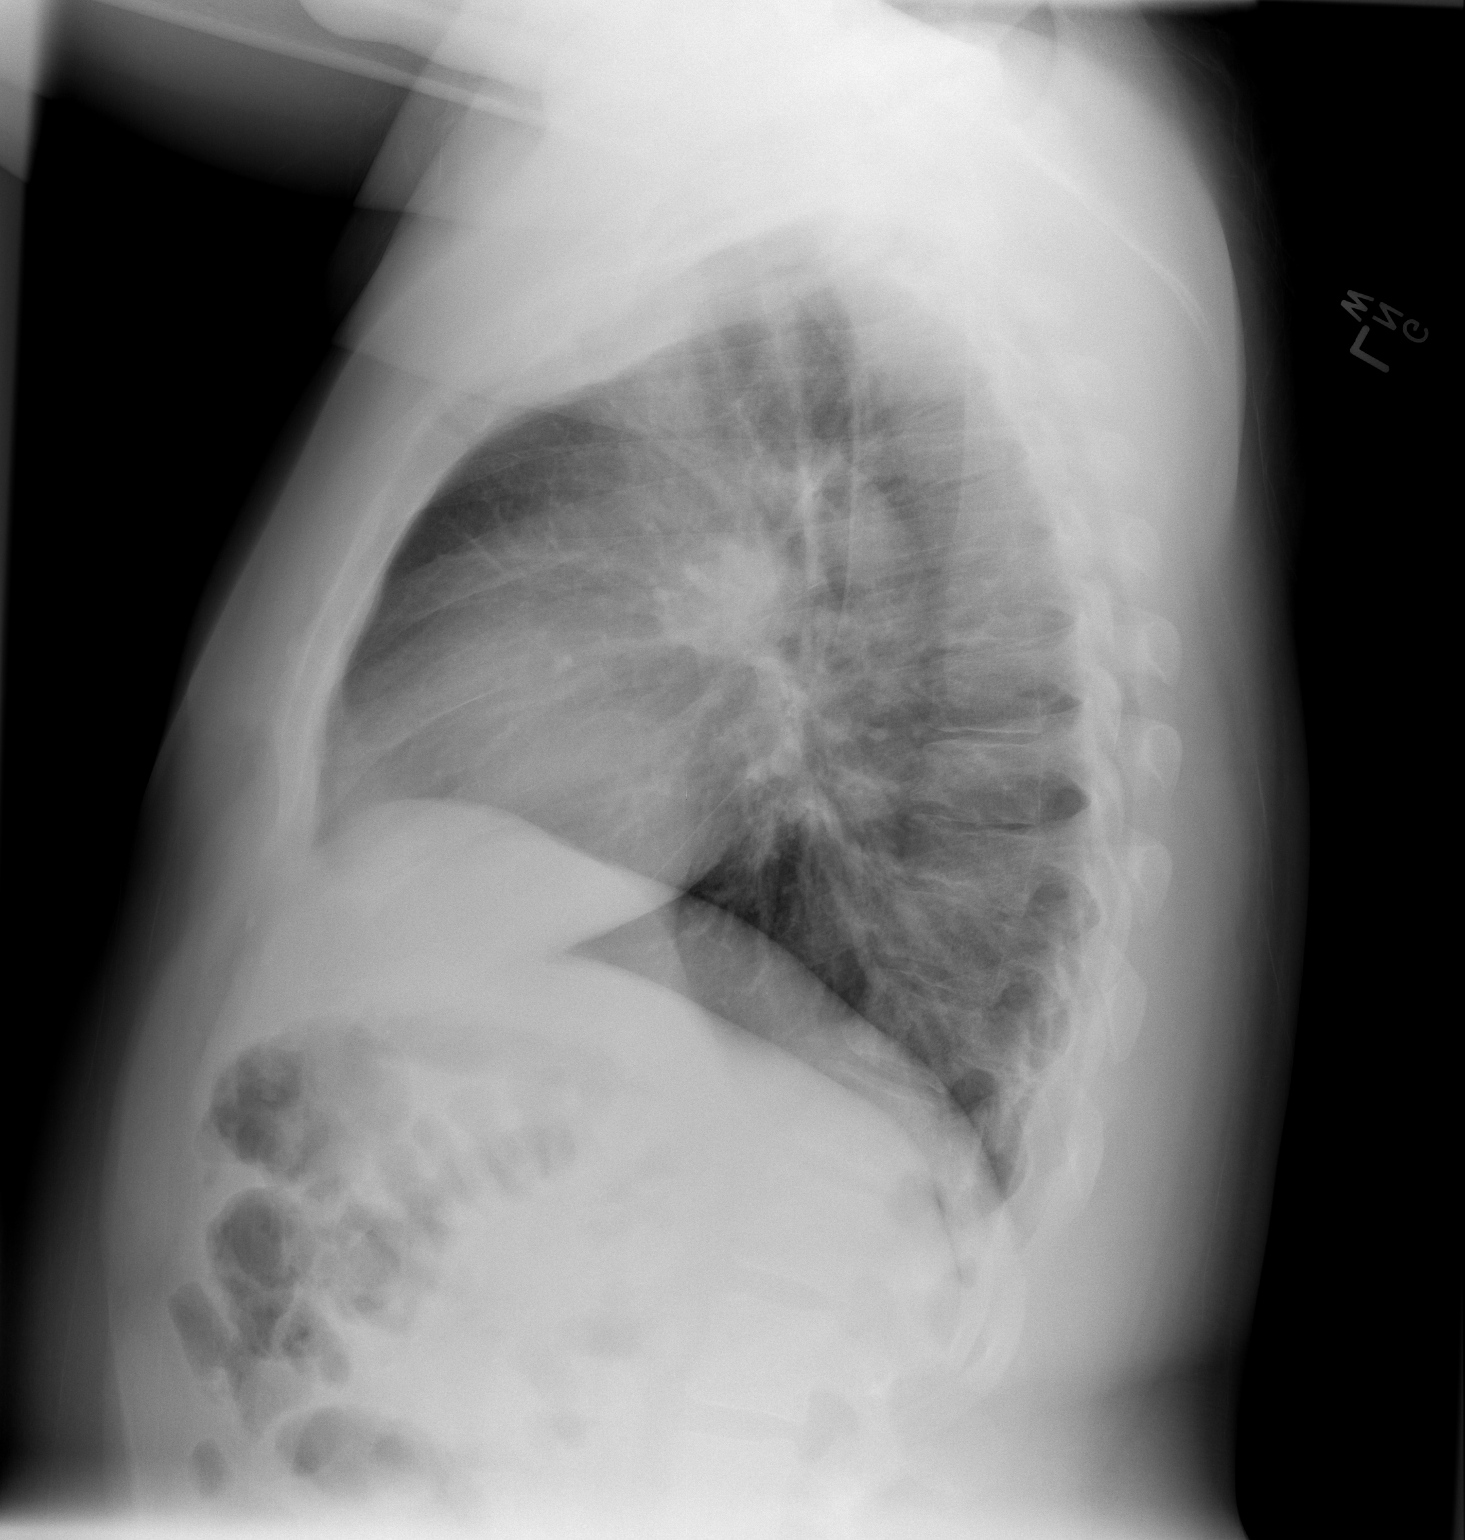

[2 of 2 positions shown; findings below may reference images not displayed]

FINDINGS: Mediastinum and hilar structures are normal. No acute
cardiopulmonary disease noted. No pleural effusion or pneumothorax.
No acute bony abnormality. Chest is stable from prior study.
IMPRESSION: No active cardiopulmonary disease.

## 2018-11-26 ENCOUNTER — Ambulatory Visit: Payer: Self-pay | Admitting: General Practice
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
## 2018-12-08 ENCOUNTER — Encounter (HOSPITAL_COMMUNITY): Payer: Self-pay

## 2018-12-08 ENCOUNTER — Ambulatory Visit (HOSPITAL_COMMUNITY)
Admission: EM | Admit: 2018-12-08 | Discharge: 2018-12-08 | Disposition: A | Discharge status: Home / Self Care | Payer: Self-pay | Attending: Family Medicine | Admitting: Family Medicine

## 2018-12-08 ENCOUNTER — Other Ambulatory Visit: Payer: Self-pay

## 2018-12-08 DIAGNOSIS — M25572 Pain in left ankle and joints of left foot: Secondary | ICD-10-CM

## 2018-12-08 NOTE — Discharge Instructions (Signed)
Start naproxen 440mg  twice a day for the next 5-7 days for inflammation/pain. Continue ice compress, elevation, ankle wrap during activity. This may take a few weeks to completely resolve, but should be feeling better each week. Follow up for further evaluation if symptoms not improving.
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
## 2021-05-20 ENCOUNTER — Emergency Department (HOSPITAL_COMMUNITY)
Admission: EM | Admit: 2021-05-20 | Discharge: 2021-05-20 | Disposition: A | Discharge status: Home / Self Care | Payer: Self-pay | Attending: Medical | Admitting: Medical

## 2021-05-20 ENCOUNTER — Other Ambulatory Visit: Payer: Self-pay

## 2021-05-20 DIAGNOSIS — R55 Syncope and collapse: Secondary | ICD-10-CM | POA: Insufficient documentation

## 2021-05-20 DIAGNOSIS — Z5321 Procedure and treatment not carried out due to patient leaving prior to being seen by health care provider: Secondary | ICD-10-CM | POA: Insufficient documentation

## 2021-05-20 DIAGNOSIS — M79609 Pain in unspecified limb: Secondary | ICD-10-CM | POA: Insufficient documentation

## 2021-05-20 LAB — CBC WITH DIFFERENTIAL/PLATELET
Abs Immature Granulocytes: 0.01 10*3/uL (ref 0.00–0.07)
Basophils Absolute: 0 10*3/uL (ref 0.0–0.1)
Basophils Relative: 1 %
Eosinophils Absolute: 0.3 10*3/uL (ref 0.0–0.5)
Eosinophils Relative: 5 %
HCT: 47.9 % (ref 39.0–52.0)
Hemoglobin: 15.6 g/dL (ref 13.0–17.0)
Immature Granulocytes: 0 %
Lymphocytes Relative: 39 %
Lymphs Abs: 2.5 10*3/uL (ref 0.7–4.0)
MCH: 28.9 pg (ref 26.0–34.0)
MCHC: 32.6 g/dL (ref 30.0–36.0)
MCV: 88.7 fL (ref 80.0–100.0)
Monocytes Absolute: 0.7 10*3/uL (ref 0.1–1.0)
Monocytes Relative: 10 %
Neutro Abs: 3 10*3/uL (ref 1.7–7.7)
Neutrophils Relative %: 45 %
Platelets: 278 10*3/uL (ref 150–400)
RBC: 5.4 MIL/uL (ref 4.22–5.81)
RDW: 14.2 % (ref 11.5–15.5)
WBC: 6.5 10*3/uL (ref 4.0–10.5)
nRBC: 0 % (ref 0.0–0.2)

## 2021-05-20 LAB — BASIC METABOLIC PANEL
Anion gap: 7 (ref 5–15)
BUN: 6 mg/dL (ref 6–20)
CO2: 25 mmol/L (ref 22–32)
Calcium: 9.4 mg/dL (ref 8.9–10.3)
Chloride: 105 mmol/L (ref 98–111)
Creatinine, Ser: 1.04 mg/dL (ref 0.61–1.24)
GFR, Estimated: >60 mL/min (ref >60)
Glucose, Bld: 88 mg/dL (ref 70–99)
Potassium: 4.4 mmol/L (ref 3.5–5.1)
Sodium: 137 mmol/L (ref 135–145)

## 2021-05-20 LAB — URINALYSIS, ROUTINE W REFLEX MICROSCOPIC
Bilirubin Urine: NEGATIVE
Glucose, UA: NEGATIVE mg/dL
Hgb urine dipstick: NEGATIVE
Ketones, ur: NEGATIVE mg/dL
Leukocytes,Ua: NEGATIVE
Nitrite: NEGATIVE
Protein, ur: NEGATIVE mg/dL
Specific Gravity, Urine: 1.008 (ref 1.005–1.030)
pH: 7 (ref 5.0–8.0)

## 2021-05-20 NOTE — ED Notes (Signed)
Called pt 3x for vials, no response
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
## 2021-05-20 NOTE — ED Notes (Signed)
Pt not responding for vitals x2 

## 2024-04-13 ENCOUNTER — Emergency Department (HOSPITAL_COMMUNITY)

## 2024-04-13 ENCOUNTER — Other Ambulatory Visit: Payer: Self-pay

## 2024-04-13 ENCOUNTER — Emergency Department (HOSPITAL_COMMUNITY)
Admission: EM | Admit: 2024-04-13 | Discharge: 2024-04-13 | Disposition: A | Discharge status: Home / Self Care | Attending: Emergency Medicine | Admitting: Emergency Medicine

## 2024-04-13 DIAGNOSIS — F17200 Nicotine dependence, unspecified, uncomplicated: Secondary | ICD-10-CM | POA: Insufficient documentation

## 2024-04-13 DIAGNOSIS — R059 Cough, unspecified: Secondary | ICD-10-CM | POA: Diagnosis not present

## 2024-04-13 DIAGNOSIS — R066 Hiccough: Secondary | ICD-10-CM | POA: Insufficient documentation

## 2024-04-13 DIAGNOSIS — R062 Wheezing: Secondary | ICD-10-CM | POA: Diagnosis not present

## 2024-04-13 LAB — COMPREHENSIVE METABOLIC PANEL WITH GFR
ALT: 38 U/L (ref 0–44)
AST: 30 U/L (ref 15–41)
Albumin: 3.3 g/dL — ABNORMAL LOW (ref 3.5–5.0)
Alkaline Phosphatase: 70 U/L (ref 38–126)
Anion gap: 12 (ref 5–15)
BUN: 6 mg/dL (ref 6–20)
CO2: 23 mmol/L (ref 22–32)
Calcium: 9.2 mg/dL (ref 8.9–10.3)
Chloride: 103 mmol/L (ref 98–111)
Creatinine, Ser: 0.96 mg/dL (ref 0.61–1.24)
GFR, Estimated: >60 mL/min (ref >60)
Glucose, Bld: 102 mg/dL — ABNORMAL HIGH (ref 70–99)
Potassium: 3.8 mmol/L (ref 3.5–5.1)
Sodium: 138 mmol/L (ref 135–145)
Total Bilirubin: 0.7 mg/dL (ref 0.0–1.2)
Total Protein: 7.5 g/dL (ref 6.5–8.1)

## 2024-04-13 LAB — CBC WITH DIFFERENTIAL/PLATELET
Abs Immature Granulocytes: 0.02 K/uL (ref 0.00–0.07)
Basophils Absolute: 0 K/uL (ref 0.0–0.1)
Basophils Relative: 1 %
Eosinophils Absolute: 0.4 K/uL (ref 0.0–0.5)
Eosinophils Relative: 7 %
HCT: 47.5 % (ref 39.0–52.0)
Hemoglobin: 15.7 g/dL (ref 13.0–17.0)
Immature Granulocytes: 0 %
Lymphocytes Relative: 25 %
Lymphs Abs: 1.4 K/uL (ref 0.7–4.0)
MCH: 28.5 pg (ref 26.0–34.0)
MCHC: 33.1 g/dL (ref 30.0–36.0)
MCV: 86.4 fL (ref 80.0–100.0)
Monocytes Absolute: 0.7 K/uL (ref 0.1–1.0)
Monocytes Relative: 13 %
Neutro Abs: 3 K/uL (ref 1.7–7.7)
Neutrophils Relative %: 54 %
Platelets: 296 K/uL (ref 150–400)
RBC: 5.5 MIL/uL (ref 4.22–5.81)
RDW: 14 % (ref 11.5–15.5)
WBC: 5.5 K/uL (ref 4.0–10.5)
nRBC: 0 % (ref 0.0–0.2)

## 2024-04-13 LAB — RESP PANEL BY RT-PCR (RSV, FLU A&B, COVID)  RVPGX2
Influenza A by PCR: NEGATIVE
Influenza B by PCR: NEGATIVE
Resp Syncytial Virus by PCR: NEGATIVE
SARS Coronavirus 2 by RT PCR: NEGATIVE

## 2024-04-13 LAB — TROPONIN I (HIGH SENSITIVITY): Troponin I (High Sensitivity): 6 ng/L (ref <18)

## 2024-04-13 MED ORDER — ALBUTEROL SULFATE HFA 108 (90 BASE) MCG/ACT IN AERS
2.0000 | INHALATION_SPRAY | Freq: Once | RESPIRATORY_TRACT | Status: AC
  Administered 2024-04-13: 2 via RESPIRATORY_TRACT
  Filled 2024-04-13: qty 6.7

## 2024-04-13 MED ORDER — ALBUTEROL SULFATE HFA 108 (90 BASE) MCG/ACT IN AERS: 2.0000 | INHALATION_SPRAY | Freq: Two times a day (BID) | RESPIRATORY_TRACT | Status: DC | PRN

## 2024-04-13 NOTE — ED Triage Notes (Addendum)
 Pt presents to ED with hiccups x 14 hrs. Requesting GI cocktail.  Also complains of CP related to this.

## 2024-04-13 NOTE — ED Notes (Signed)
 CCMD called.

## 2024-04-13 NOTE — ED Notes (Signed)
Patient placed onto cardiac monitor

## 2024-04-13 NOTE — ED Provider Notes (Signed)
 [place] EM[facility] AT [facility] Provider Note  CSN: [id number] Arrival date & time: 04/13/24 9267  Chief Complaint(s) Hiccups and Chest Pain  HPI Juan Park is a 38 y.o. male with past medical history of bronchitis in childhood and tobacco use (half a pack a day) who presented with persistent hiccups for 14 hours, now resolved upon arrival after getting GI cocktail.  He reports that hiccups have occurred in the past, often lasting a long time and sometimes relieved with a GI cocktail.  He attributes his episode to reflux or acid irritation.  He also reports new onset of cough (mild, productive with scant mucus) and wheezing since yesterday.  Denies fever, chills, congestion or flulike symptoms.  No chest pain except for esophageal discomfort during he coughs.  No shortness of breath currently, but has some dyspnea while hiccuping.  Past endoscopy years ago was unremarkable.  No history of asthma, inhaler use, hypertension or diabetes.  Allergy to erythromycin and azithromycin.   Past Medical History No past medical history on file. There are no active problems to display for this patient.  Home Medication(s) Prior to Admission medications   Medication Sig Start Date End Date Taking? Authorizing Provider  chlorproMAZINE  (THORAZINE ) 50 MG tablet Take 1 tablet (50 mg total) by mouth 3 (three) times daily as needed (Hiccups). 08/03/14   Deanna Cough, MD  predniSONE  (DELTASONE ) 10 MG tablet Take 2 tablets (20 mg total) by mouth daily. 05/21/14   Dasie Faden, MD                                                                                                                                    Past Surgical History No past surgical history on file. Family History No family history on file.  Social History Social History   Tobacco Use   Smoking status: Every Day   Smokeless tobacco: Never  Substance Use Topics   Alcohol use: Yes    Allergies Erythromycin  Review of Systems A thorough review of systems was obtained and all systems are negative except as noted in the HPI and PMH.   Physical Exam Vital Signs  I have reviewed the triage vital signs BP (!) 142/86 (BP Location: Right Arm)   Pulse 69   Temp 98.7 F (37.1 C)   Resp 18   Ht 5' 6 (1.676 m)   Wt 99.8 kg   SpO2 100%   BMI 35.51 kg/m  General: Alert, no acute distress.  HEENT: Soft, supple, with full range of motion. No cervical lymphadenopathy or thyromegaly appreciated. Cardiac: Regular rate and rhythm, normal S1 and S2 Lungs:Generalized wheezes all over lund. Normal respiratory effort. Abdomen: Non-distended, soft, non-tender Extremities: Warm and well-perfused, with no edema.es. Neurological: Alert and oriented 4. No focal deficits. Skin: No rashes noted.  ED Results and Treatments Labs (all labs ordered are listed, but only abnormal results are displayed) Labs Reviewed  RESP PANEL BY RT-PCR (RSV, FLU A&B, COVID)  RVPGX2  COMPREHENSIVE METABOLIC PANEL WITH GFR  CBC WITH DIFFERENTIAL/PLATELET  TROPONIN I (HIGH SENSITIVITY)                                                                                                                          Radiology DG Chest 1 View Result Date: 04/13/2024 CLINICAL DATA:  Pneumonia, hiccups and cough. EXAM: CHEST  1 VIEW COMPARISON:  05/21/2014. FINDINGS: Trachea is midline. Heart size is accentuated by low lung volumes. Lungs are clear. No pleural fluid. IMPRESSION: No acute findings. Electronically Signed   By: Newell Eke M.D.   On: 04/13/2024 08:10    Pertinent labs & imaging results that were available during my care of the patient were reviewed by me and considered in my medical decision making (see MDM for details).  Medications Ordered in ED Medications  albuterol  (VENTOLIN  HFA) 108 (90 Base) MCG/ACT inhaler 2 puff (has no administration in time range)  albuterol  (VENTOLIN  HFA) 108 (90  Base) MCG/ACT inhaler 2 puff (has no administration in time range)                                                                   Medical Decision Making / ED Course Medical Decision Making:     Fedor A Warchol is a 38 y.o. male  #Persistent hiccups Likely secondary to GI reflux or acid irritation given history of similar episodes, prior response to GI cocktail, and lack of red flag symptoms. - Symptoms resolved after GI cocktail on 80  #New cough and wheezing Likely acute bronchitis with asthma/reactive airway episode, in context of smoking - Chest Xray: No acute findings.  - Ventolin  2 puff   Lab Tests: -I ordered, reviewed, and interpreted labs.   The pertinent results include:   Labs Reviewed  RESP PANEL BY RT-PCR (RSV, FLU A&B, COVID)  RVPGX2  COMPREHENSIVE METABOLIC PANEL WITH GFR  CBC WITH DIFFERENTIAL/PLATELET  TROPONIN I (HIGH SENSITIVITY)      EKG   EKG Interpretation Date/Time:    Ventricular Rate:    PR Interval:    QRS Duration:    QT Interval:    QTC Calculation:   R Axis:      Text Interpretation:           Imaging Studies ordered: I ordered imaging studies including Chest Xray:  I independently visualized the following imaging with scope of interpretation limited to determining acute life threatening conditions related to emergency care; findings noted above I agree with the radiologist interpretation If any imaging was obtained with contrast I closely monitored patient for any possible adverse reaction a/w contrast administration in the emergency department   Medicines ordered and prescription  drug management: Meds ordered this encounter  Medications   albuterol  (VENTOLIN  HFA) 108 (90 Base) MCG/ACT inhaler 2 puff   albuterol  (VENTOLIN  HFA) 108 (90 Base) MCG/ACT inhaler 2 puff    -I have reviewed the patients home medicines and have made adjustments as needed   Cardiac Monitoring: The patient was maintained on a cardiac monitor.  I  personally viewed and interpreted the cardiac monitored which showed an underlying rhythm of: normal sinus rhythm Continuous pulse oximetry interpreted by myself, 100% on room air.    Reevaluation: After the interventions noted above, I reevaluated the patient and found that they have resolved  Co morbidities that complicate the patient evaluation No past medical history on file.    Dispostion: Disposition decision including need for hospitalization was considered, and patient discharged from emergency department.    Final Clinical Impression(s) / ED Diagnoses Final diagnoses:  None        Bernadine Manos, MD 04/13/24 0943    Pamella Ozell LABOR, DO 04/19/24 [phone]
